# Patient Record
Sex: Female | Born: 1970 | Race: Black or African American | Hispanic: No | Marital: Married | Smoking: Never smoker
Health system: Southern US, Community
[De-identification: ages and names within clinical notes are randomized; demographics above are authoritative.]

## PROBLEM LIST (undated history)

## (undated) DIAGNOSIS — I10 Essential (primary) hypertension: Secondary | ICD-10-CM

## (undated) HISTORY — PX: CHOLECYSTECTOMY: SHX55

## (undated) HISTORY — DX: Essential (primary) hypertension: I10

## (undated) HISTORY — DX: Morbid (severe) obesity due to excess calories: E66.01

---

## 1997-11-10 ENCOUNTER — Other Ambulatory Visit: Admission: RE | Admit: 1997-11-10 | Discharge: 1997-11-10 | Payer: Self-pay | Admitting: Obstetrics and Gynecology

## 1998-05-31 ENCOUNTER — Inpatient Hospital Stay (HOSPITAL_COMMUNITY): Admission: AD | Admit: 1998-05-31 | Discharge: 1998-05-31 | Payer: Self-pay | Admitting: Obstetrics and Gynecology

## 1998-06-01 ENCOUNTER — Ambulatory Visit (HOSPITAL_COMMUNITY): Admission: RE | Admit: 1998-06-01 | Discharge: 1998-06-01 | Payer: Self-pay | Admitting: Obstetrics and Gynecology

## 1998-06-07 ENCOUNTER — Inpatient Hospital Stay (HOSPITAL_COMMUNITY): Admission: AD | Admit: 1998-06-07 | Discharge: 1998-06-10 | Payer: Self-pay | Admitting: Obstetrics and Gynecology

## 1998-06-13 ENCOUNTER — Encounter (HOSPITAL_COMMUNITY): Admission: RE | Admit: 1998-06-13 | Discharge: 1998-09-11 | Payer: Self-pay | Admitting: Obstetrics and Gynecology

## 1998-07-10 ENCOUNTER — Other Ambulatory Visit: Admission: RE | Admit: 1998-07-10 | Discharge: 1998-07-10 | Payer: Self-pay | Admitting: Obstetrics and Gynecology

## 1998-11-21 ENCOUNTER — Ambulatory Visit (HOSPITAL_COMMUNITY): Admission: RE | Admit: 1998-11-21 | Discharge: 1998-11-21 | Payer: Self-pay | Admitting: Internal Medicine

## 1998-11-21 ENCOUNTER — Encounter: Payer: Self-pay | Admitting: Internal Medicine

## 1998-11-23 ENCOUNTER — Ambulatory Visit (HOSPITAL_COMMUNITY): Admission: RE | Admit: 1998-11-23 | Discharge: 1998-11-23 | Payer: Self-pay | Admitting: Gynecology

## 1998-11-23 ENCOUNTER — Encounter: Payer: Self-pay | Admitting: Gynecology

## 1998-12-12 ENCOUNTER — Encounter (HOSPITAL_BASED_OUTPATIENT_CLINIC_OR_DEPARTMENT_OTHER): Payer: Self-pay | Admitting: General Surgery

## 1998-12-13 ENCOUNTER — Encounter (HOSPITAL_BASED_OUTPATIENT_CLINIC_OR_DEPARTMENT_OTHER): Payer: Self-pay | Admitting: General Surgery

## 1998-12-13 ENCOUNTER — Ambulatory Visit (HOSPITAL_COMMUNITY): Admission: RE | Admit: 1998-12-13 | Discharge: 1998-12-14 | Payer: Self-pay | Admitting: General Surgery

## 1999-03-18 ENCOUNTER — Ambulatory Visit (HOSPITAL_COMMUNITY): Admission: RE | Admit: 1999-03-18 | Discharge: 1999-03-18 | Payer: Self-pay | Admitting: General Surgery

## 1999-07-04 ENCOUNTER — Other Ambulatory Visit: Admission: RE | Admit: 1999-07-04 | Discharge: 1999-07-04 | Payer: Self-pay | Admitting: Obstetrics and Gynecology

## 2000-01-02 ENCOUNTER — Inpatient Hospital Stay (HOSPITAL_COMMUNITY): Admission: AD | Admit: 2000-01-02 | Discharge: 2000-01-04 | Payer: Self-pay | Admitting: Obstetrics and Gynecology

## 2000-02-12 ENCOUNTER — Other Ambulatory Visit: Admission: RE | Admit: 2000-02-12 | Discharge: 2000-02-12 | Payer: Self-pay | Admitting: Obstetrics and Gynecology

## 2008-07-12 ENCOUNTER — Encounter: Admission: RE | Admit: 2008-07-12 | Discharge: 2008-07-12 | Payer: Self-pay | Admitting: Internal Medicine

## 2010-05-25 ENCOUNTER — Encounter: Payer: Self-pay | Admitting: Internal Medicine

## 2011-12-30 LAB — LIPID PANEL
HDL: 59 mg/dL (ref 35–70)
LDL Cholesterol: 139 mg/dL
Triglycerides: 85 mg/dL (ref 40–160)

## 2011-12-30 LAB — BASIC METABOLIC PANEL
Glucose: 86 mg/dL
Sodium: 139 mmol/L (ref 137–147)

## 2011-12-30 LAB — CBC AND DIFFERENTIAL
HCT: 34 % — AB (ref 36–46)
Hemoglobin: 11.4 g/dL — AB (ref 12.0–16.0)
Platelets: 447 10*3/uL — AB (ref 150–399)
WBC: 6 10^3/mL

## 2011-12-30 LAB — TSH: TSH: 0.79 u[IU]/mL (ref 0.41–5.90)

## 2012-08-10 ENCOUNTER — Telehealth: Payer: Self-pay | Admitting: *Deleted

## 2012-08-10 NOTE — Telephone Encounter (Signed)
PT CAME IN A WHILE BACK AND DR. Herma Carson PRESCRIBED MEDS FOR HIGH BLOOD PRESSURE AND PT SAID NOT COVERED UNDER INS HAD TO PAY 160.00  PT IS DUE FOR A REFILL NOW AND WANTED TO KNOW IF DR. Z COULD DO GENERIC FORM THAT IS COVERED UNDER HER INS.  INS IS [employer].  PHARMACY USED - [facility] IN [place].

## 2012-08-11 NOTE — Telephone Encounter (Signed)
Please schedule her for an appt.  We can offer her some samples of her bp med but Dr Herma Carson has to change her bp med on an office visit.  PG

## 2012-08-18 ENCOUNTER — Encounter: Payer: Self-pay | Admitting: *Deleted

## 2012-08-18 DIAGNOSIS — I10 Essential (primary) hypertension: Secondary | ICD-10-CM | POA: Insufficient documentation

## 2012-08-24 ENCOUNTER — Encounter: Payer: Self-pay | Admitting: Family Medicine

## 2012-08-24 ENCOUNTER — Ambulatory Visit (INDEPENDENT_AMBULATORY_CARE_PROVIDER_SITE_OTHER): Payer: 59 | Admitting: Family Medicine

## 2012-08-24 VITALS — BP 137/91 | HR 82 | Wt 280.0 lb

## 2012-08-24 DIAGNOSIS — I10 Essential (primary) hypertension: Secondary | ICD-10-CM

## 2012-08-24 MED ORDER — OLMESARTAN MEDOXOMIL-HCTZ 40-25 MG PO TABS: 1.0000 | ORAL_TABLET | Freq: Every day | ORAL | Status: DC

## 2012-08-24 MED ORDER — AMLODIPINE BESYLATE 5 MG PO TABS: 5.0000 mg | ORAL_TABLET | Freq: Every day | ORAL | Status: DC

## 2012-08-24 NOTE — Patient Instructions (Addendum)
1)  Blood Pressure -  Since your insurance will not cover Tribenzor - see if they will cover the combination of Benicar HCT 40/25 and amlodipine 5mg .  If they won't then check to see if they will cover irbesartan 300 mg plus you'll take the HCT 25 mg and amlodipine 5 mg separately.  Let us know if you need a prescription for the HCT and irbesartan.        DASH Diet The DASH diet stands for "Dietary Approaches to Stop Hypertension." It is a healthy eating plan that has been shown to reduce high blood pressure (hypertension) in as little as 14 days, while also possibly providing other significant health benefits. These other health benefits include reducing the risk of breast cancer after menopause and reducing the risk of type 2 diabetes, heart disease, colon cancer, and stroke. Health benefits also include weight loss and slowing kidney failure in patients with chronic kidney disease.  DIET GUIDELINES  Limit salt (sodium). Your diet should contain less than 1500 mg of sodium daily.  Limit refined or processed carbohydrates. Your diet should include mostly whole grains. Desserts and added sugars should be used sparingly.  Include small amounts of heart-healthy fats. These types of fats include nuts, oils, and tub margarine. Limit saturated and trans fats. These fats have been shown to be harmful in the body. CHOOSING FOODS  The following food groups are based on a 2000 calorie diet. See your Registered Dietitian for individual calorie needs. Grains and Grain Products (6 to 8 servings daily)  Eat More Often: Whole-wheat bread, brown rice, whole-grain or wheat pasta, quinoa, popcorn without added fat or salt (air popped).  Eat Less Often: White bread, white pasta, white rice, cornbread. Vegetables (4 to 5 servings daily)  Eat More Often: Fresh, frozen, and canned vegetables. Vegetables may be raw, steamed, roasted, or grilled with a minimal amount of fat.  Eat Less Often/Avoid: Creamed or fried  vegetables. Vegetables in a cheese sauce. Fruit (4 to 5 servings daily)  Eat More Often: All fresh, canned (in natural juice), or frozen fruits. Dried fruits without added sugar. One hundred percent fruit juice ( cup [237 mL] daily).  Eat Less Often: Dried fruits with added sugar. Canned fruit in light or heavy syrup. [employer], Fish, and Poultry (2 servings or less daily. One serving is 3 to 4 oz [85-114 g]).  Eat More Often: Ninety percent or leaner ground beef, tenderloin, sirloin. Round cuts of beef, chicken breast, [place] breast. All fish. Grill, bake, or broil your meat. Nothing should be fried.  Eat Less Often/Avoid: Fatty cuts of meat, [place], or chicken leg, thigh, or wing. Fried cuts of meat or fish. Dairy (2 to 3 servings)  Eat More Often: Low-fat or fat-free milk, low-fat plain or light yogurt, reduced-fat or part-skim cheese.  Eat Less Often/Avoid: Milk (whole, 2%).Whole milk yogurt. Full-fat cheeses. Nuts, Seeds, and Legumes (4 to 5 servings per week)  Eat More Often: All without added salt.  Eat Less Often/Avoid: Salted nuts and seeds, canned beans with added salt. Fats and Sweets (limited)  Eat More Often: Vegetable oils, tub margarines without trans fats, sugar-free gelatin. Mayonnaise and salad dressings.  Eat Less Often/Avoid: Coconut oils, palm oils, butter, stick margarine, cream, half and half, cookies, candy, pie. FOR MORE INFORMATION The Dash Diet Eating Plan: www.dashdiet.org Document Released: 04/10/2011 Document Revised: 07/14/2011 Document Reviewed: 04/10/2011 [facility] Patient Information 2013 [place], [place].

## 2012-08-24 NOTE — Progress Notes (Signed)
Subjective:     Patient ID: Marie Hammond, female   DOB: 01-07-1971, 42 y.o.   MRN: [id number]  HPI Marie Hammond is here today to discuss her hypertension medication (Tribenzor).  It is not covered by her insurance so she would like to be changed to something else. Tribenzor works very well for her.  She does not have any side effects with it.  Her BP is elevated today because she has been out of the [employer].     Review of Systems  Constitutional: Negative for fatigue and unexpected weight change.  Cardiovascular: Negative for chest pain, palpitations and leg swelling.  Gastrointestinal: Negative for abdominal pain.  Genitourinary: Negative for difficulty urinating.  Musculoskeletal: Negative.   Skin: Negative.   Psychiatric/Behavioral: Negative for sleep disturbance.   Past Medical History  Diagnosis Date  . Hypertension   . Morbid obesity    Family History  Problem Relation Age of Onset  . Cancer Paternal Aunt     Paternal Aunt  . Heart disease Maternal Grandfather   . Cancer Cousin     Breast - Paternal Cousin    History   Social History Narrative   Marital Status: Marie Hammond [phone])   Children:  Son Marie Hammond); Daughter Marie Hammond)   Pets: Dogs (2)    Living Situation: Lives with husband and children.    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 No distress.  HENT:  Head: Normocephalic.  Eyes: No scleral icterus.  Neck: No thyromegaly present.  Cardiovascular: Normal rate, regular rhythm and normal heart sounds.   Pulmonary/Chest: Effort normal and breath sounds normal.  Abdominal: There is no tenderness.  Musculoskeletal: She exhibits no edema  and no tenderness.  Neurological: She is alert.  Skin: Skin is warm and dry.  Psychiatric: She has a normal mood and affect. Her behavior is normal. Judgment and thought content normal.       Assessment:     Hypertension  Plan:     She will check with her insurance to see if they will cover a combination of Benicar HCT and amlodipine.  If they don't cover this combination she'll see if they cover irbesartan 300 mg plus HCTZ plus amlodipine.  She was also given a handout for the DASH Diet.      TIME 15 MINUTES:  MORE THAN 50 % OF TIME WAS INVOLVED IN COUNSELING.

## 2012-08-29 ENCOUNTER — Encounter: Payer: Self-pay | Admitting: Family Medicine

## 2013-02-03 ENCOUNTER — Other Ambulatory Visit: Payer: Self-pay | Admitting: Family Medicine

## 2013-02-03 DIAGNOSIS — Z1231 Encounter for screening mammogram for malignant neoplasm of breast: Secondary | ICD-10-CM

## 2013-02-07 ENCOUNTER — Ambulatory Visit (HOSPITAL_BASED_OUTPATIENT_CLINIC_OR_DEPARTMENT_OTHER)
Admission: RE | Admit: 2013-02-07 | Discharge: 2013-02-07 | Disposition: A | Discharge status: Home / Self Care | Payer: 59 | Source: Ambulatory Visit | Attending: Family Medicine | Admitting: Family Medicine

## 2013-02-07 DIAGNOSIS — Z1231 Encounter for screening mammogram for malignant neoplasm of breast: Secondary | ICD-10-CM | POA: Insufficient documentation

## 2013-02-09 ENCOUNTER — Other Ambulatory Visit: Payer: Self-pay | Admitting: Family Medicine

## 2013-02-09 DIAGNOSIS — R928 Other abnormal and inconclusive findings on diagnostic imaging of breast: Secondary | ICD-10-CM

## 2013-02-24 ENCOUNTER — Ambulatory Visit
Admission: RE | Admit: 2013-02-24 | Discharge: 2013-02-24 | Disposition: A | Discharge status: Home / Self Care | Payer: 59 | Source: Ambulatory Visit | Attending: Family Medicine | Admitting: Family Medicine

## 2013-02-24 DIAGNOSIS — R928 Other abnormal and inconclusive findings on diagnostic imaging of breast: Secondary | ICD-10-CM

## 2013-10-04 ENCOUNTER — Other Ambulatory Visit: Payer: Self-pay | Admitting: *Deleted

## 2013-10-04 DIAGNOSIS — I10 Essential (primary) hypertension: Secondary | ICD-10-CM

## 2013-10-04 MED ORDER — OLMESARTAN MEDOXOMIL-HCTZ 40-25 MG PO TABS: 1.0000 | ORAL_TABLET | Freq: Every day | ORAL | Status: AC

## 2013-10-21 ENCOUNTER — Other Ambulatory Visit: Payer: 59

## 2013-10-21 ENCOUNTER — Other Ambulatory Visit: Payer: Self-pay | Admitting: *Deleted

## 2013-10-21 DIAGNOSIS — I10 Essential (primary) hypertension: Secondary | ICD-10-CM

## 2013-10-21 DIAGNOSIS — Z0000 Encounter for general adult medical examination without abnormal findings: Secondary | ICD-10-CM

## 2013-10-24 ENCOUNTER — Other Ambulatory Visit: Payer: 59

## 2013-10-24 LAB — COMPLETE METABOLIC PANEL WITH GFR
ALT: 12 U/L (ref 0–35)
AST: 14 U/L (ref 0–37)
Albumin: 4 g/dL (ref 3.5–5.2)
Alkaline Phosphatase: 58 U/L (ref 39–117)
BUN: 11 mg/dL (ref 6–23)
CO2: 26 mEq/L (ref 19–32)
Calcium: 9.7 mg/dL (ref 8.4–10.5)
Chloride: 102 mEq/L (ref 96–112)
Creat: 0.96 mg/dL (ref 0.50–1.10)
GFR, Est African American: 85 mL/min
GFR, Est Non African American: 74 mL/min
Glucose, Bld: 112 mg/dL — ABNORMAL HIGH (ref 70–99)
Potassium: 3.7 mEq/L (ref 3.5–5.3)
Sodium: 137 mEq/L (ref 135–145)
Total Bilirubin: 0.4 mg/dL (ref 0.2–1.2)
Total Protein: 7.2 g/dL (ref 6.0–8.3)

## 2013-10-24 LAB — CBC WITH DIFFERENTIAL/PLATELET
Basophils Absolute: 0 10*3/uL (ref 0.0–0.1)
Basophils Relative: 0 % (ref 0–1)
Eosinophils Absolute: 0.6 10*3/uL (ref 0.0–0.7)
Eosinophils Relative: 11 % — ABNORMAL HIGH (ref 0–5)
HCT: 35.3 % — ABNORMAL LOW (ref 36.0–46.0)
Hemoglobin: 12.2 g/dL (ref 12.0–15.0)
Lymphocytes Relative: 39 % (ref 12–46)
Lymphs Abs: 2.1 10*3/uL (ref 0.7–4.0)
MCH: 29.5 pg (ref 26.0–34.0)
MCHC: 34.6 g/dL (ref 30.0–36.0)
MCV: 85.3 fL (ref 78.0–100.0)
Monocytes Absolute: 0.4 10*3/uL (ref 0.1–1.0)
Monocytes Relative: 7 % (ref 3–12)
Neutro Abs: 2.3 10*3/uL (ref 1.7–7.7)
Neutrophils Relative %: 43 % (ref 43–77)
Platelets: 425 10*3/uL — ABNORMAL HIGH (ref 150–400)
RBC: 4.14 MIL/uL (ref 3.87–5.11)
RDW: 15.7 % — ABNORMAL HIGH (ref 11.5–15.5)
WBC: 5.3 10*3/uL (ref 4.0–10.5)

## 2013-10-24 LAB — TSH: TSH: 0.847 u[IU]/mL (ref 0.350–4.500)

## 2013-10-24 LAB — LIPID PANEL
Cholesterol: 197 mg/dL (ref 0–200)
HDL: 62 mg/dL (ref >39)
LDL Cholesterol: 122 mg/dL — ABNORMAL HIGH (ref 0–99)
Total CHOL/HDL Ratio: 3.2 Ratio
Triglycerides: 63 mg/dL (ref <150)
VLDL: 13 mg/dL (ref 0–40)

## 2013-10-27 ENCOUNTER — Ambulatory Visit: Payer: 59 | Admitting: Family Medicine

## 2013-11-08 ENCOUNTER — Ambulatory Visit (INDEPENDENT_AMBULATORY_CARE_PROVIDER_SITE_OTHER): Payer: 59 | Admitting: Family Medicine

## 2013-11-08 ENCOUNTER — Encounter: Payer: Self-pay | Admitting: Family Medicine

## 2013-11-08 VITALS — BP 127/87 | HR 86 | Resp 16 | Ht 67.0 in | Wt 294.0 lb

## 2013-11-08 DIAGNOSIS — Z3009 Encounter for other general counseling and advice on contraception: Secondary | ICD-10-CM

## 2013-11-08 DIAGNOSIS — R7301 Impaired fasting glucose: Secondary | ICD-10-CM

## 2013-11-08 DIAGNOSIS — I10 Essential (primary) hypertension: Secondary | ICD-10-CM

## 2013-11-08 LAB — POCT GLYCOSYLATED HEMOGLOBIN (HGB A1C): Hemoglobin A1C: 5.8

## 2013-11-08 MED ORDER — TELMISARTAN-HCTZ 80-25 MG PO TABS: 1.0000 | ORAL_TABLET | Freq: Every day | ORAL | Status: AC

## 2013-11-08 MED ORDER — NORETHIN ACE-ETH ESTRAD-FE 1-20 MG-MCG PO TABS: 1.0000 | ORAL_TABLET | Freq: Every day | ORAL | Status: AC

## 2013-11-08 NOTE — Progress Notes (Signed)
Subjective:    Patient ID: Marie Hammond, female    DOB: 08/15/1970, 43 y.o.   MRN: [id number]  HPI  Marie Hammond is here today to follow up on her recent lab results. Her fasting glucose was elevated so we are checking an A1c. Her blood pressure was elevated when she first was roomed but it is now normal.  She is needing to get refills on the Benicar and her birth control pills.      Review of Systems  Constitutional: Negative for activity change, appetite change, fatigue and unexpected weight change.  HENT: Negative.   Eyes: Negative.   Respiratory: Negative for shortness of breath.   Cardiovascular: Negative for chest pain, palpitations and leg swelling.  Gastrointestinal: Negative for diarrhea and constipation.  Endocrine: Negative.  Negative for polydipsia, polyphagia and polyuria.  Genitourinary: Negative for difficulty urinating.  Musculoskeletal: Negative.   Skin: Negative.   Neurological: Negative.   Hematological: Negative for adenopathy. Does not bruise/bleed easily.  Psychiatric/Behavioral: Negative for behavioral problems, sleep disturbance and dysphoric mood. The patient is not nervous/anxious.   All other systems reviewed and are negative.    Past Medical History  Diagnosis Date  . Hypertension   . Morbid obesity      Past Surgical History  Procedure Laterality Date  . Cholecystectomy       History   Social History Narrative   Marital Status: Married Oswaldo ConroyFred McClean [phone](1998)   Children:  Son Chrissie Noa(William); Daughter Ephriam Knuckles(Christian)   Pets: Dogs (2)    Living Situation: Lives with husband and children.    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 Cancer Paternal  Aunt     Paternal Aunt  . Heart disease Maternal Grandfather   . Cancer Cousin     Breast - Paternal Cousin      Current Outpatient Prescriptions on File Prior to Visit  Medication Sig Dispense Refill  . olmesartan-hydrochlorothiazide (BENICAR HCT) 40-25 MG per tablet Take 1 tablet by mouth daily.  30 tablet  0   No current facility-administered medications on file prior to visit.     Allergies  Allergen Reactions  . Amoxicillin       Objective:   Physical Exam  Vitals reviewed. Constitutional: She is oriented to person, place, and time. She appears well-nourished.  Eyes: Conjunctivae are normal. No scleral icterus.  Neck: Neck supple. No thyromegaly present.  Cardiovascular: Normal rate, regular rhythm and normal heart sounds.   Pulmonary/Chest: Effort normal and breath sounds normal.  Musculoskeletal: She exhibits no edema and no tenderness.  Lymphadenopathy:    She has no cervical adenopathy.  Neurological: She is alert and oriented to person, place, and time.  Skin: Skin is warm and dry.  Psychiatric: She has a normal mood and affect. Her behavior is normal. Judgment and thought content normal.       Assessment & Plan:    Marie Hammond was seen today for medication management.  Diagnoses and associated orders for this visit:  Essential hypertension, benign Comments: We're going to let her try Micardis HCT  to see if this will control her BP a little better.   - telmisartan-hydrochlorothiazide (MICARDIS HCT) 80-25 MG per tablet; Take 1 tablet by mouth daily.  Impaired fasting glucose Comments: Her A1c is 5.7%.   - POCT HgB A1C  Other general counseling and advice for contraceptive management - norethindrone-ethinyl estradiol (JUNEL FE,GILDESS FE,LOESTRIN FE) 1-20 MG-MCG tablet; Take 1 tablet by mouth daily.   TIME SPENT "FACE TO FACE" WITH PATIENT -  30 MINS

## 2013-11-08 NOTE — Patient Instructions (Signed)
1)  Blood Sugar - Get the book "The End of Diabetes" by Dr. Monico HoarJoel Fuhrman and follow it.    2)  Blood Pressure - Take the Benicar HCT till gone then get on the Micardis HCT and be sure that you are on it when I see you for your CPE.    Exercise to Lose Weight Exercise and a healthy diet may help you lose weight. Your doctor may suggest specific exercises. EXERCISE IDEAS AND TIPS  Choose low-cost things you enjoy doing, such as walking, bicycling, or exercising to workout videos.  Take stairs instead of the elevator.  Walk during your lunch break.  Park your car further away from work or school.  Go to a gym or an exercise class.  Start with 5 to 10 minutes of exercise each day. Build up to 30 minutes of exercise 4 to 6 days a week.  Wear shoes with good support and comfortable clothes.  Stretch before and after working out.  Work out until you breathe harder and your heart beats faster.  Drink extra water when you exercise.  Do not do so much that you hurt yourself, feel dizzy, or get very short of breath. Exercises that burn about 150 calories:  Running 1  miles in 15 minutes.  Playing volleyball for 45 to 60 minutes.  Washing and waxing a car for 45 to 60 minutes.  Playing touch football for 45 minutes.  Walking 1  miles in 35 minutes.  Pushing a stroller 1  miles in 30 minutes.  Playing basketball for 30 minutes.  Raking leaves for 30 minutes.  Bicycling 5 miles in 30 minutes.  Walking 2 miles in 30 minutes.  Dancing for 30 minutes.  Shoveling snow for 15 minutes.  Swimming laps for 20 minutes.  Walking up stairs for 15 minutes.  Bicycling 4 miles in 15 minutes.  Gardening for 30 to 45 minutes.  Jumping rope for 15 minutes.  Washing windows or floors for 45 to 60 minutes. Document Released: 05/24/2010 Document Revised: 07/14/2011 Document Reviewed: 05/24/2010 [facility] Patient Information 2015 [place], [place]. This information is not intended to  replace advice given to you by your health care provider. Make sure you discuss any questions you have with your health care provider.

## 2013-11-18 ENCOUNTER — Ambulatory Visit: Payer: 59 | Admitting: Family Medicine

## 2013-12-26 DIAGNOSIS — R7301 Impaired fasting glucose: Secondary | ICD-10-CM | POA: Insufficient documentation

## 2013-12-26 DIAGNOSIS — I10 Essential (primary) hypertension: Secondary | ICD-10-CM | POA: Insufficient documentation

## 2014-07-20 IMAGING — MG MM DIGITAL DIAGNOSTIC LIMITED*R*
4 series · 4 of 4 positions shown · non-contrast
Comparison: 02/07/2013

CLINICAL DATA: Abnormal screening mammogram.

EXAM:
DIGITAL DIAGNOSTIC UNILATERAL RIGHT MAMMOGRAM LIMITED; ULTRASOUND
RIGHT BREAST

[R CC (1 of 2)]
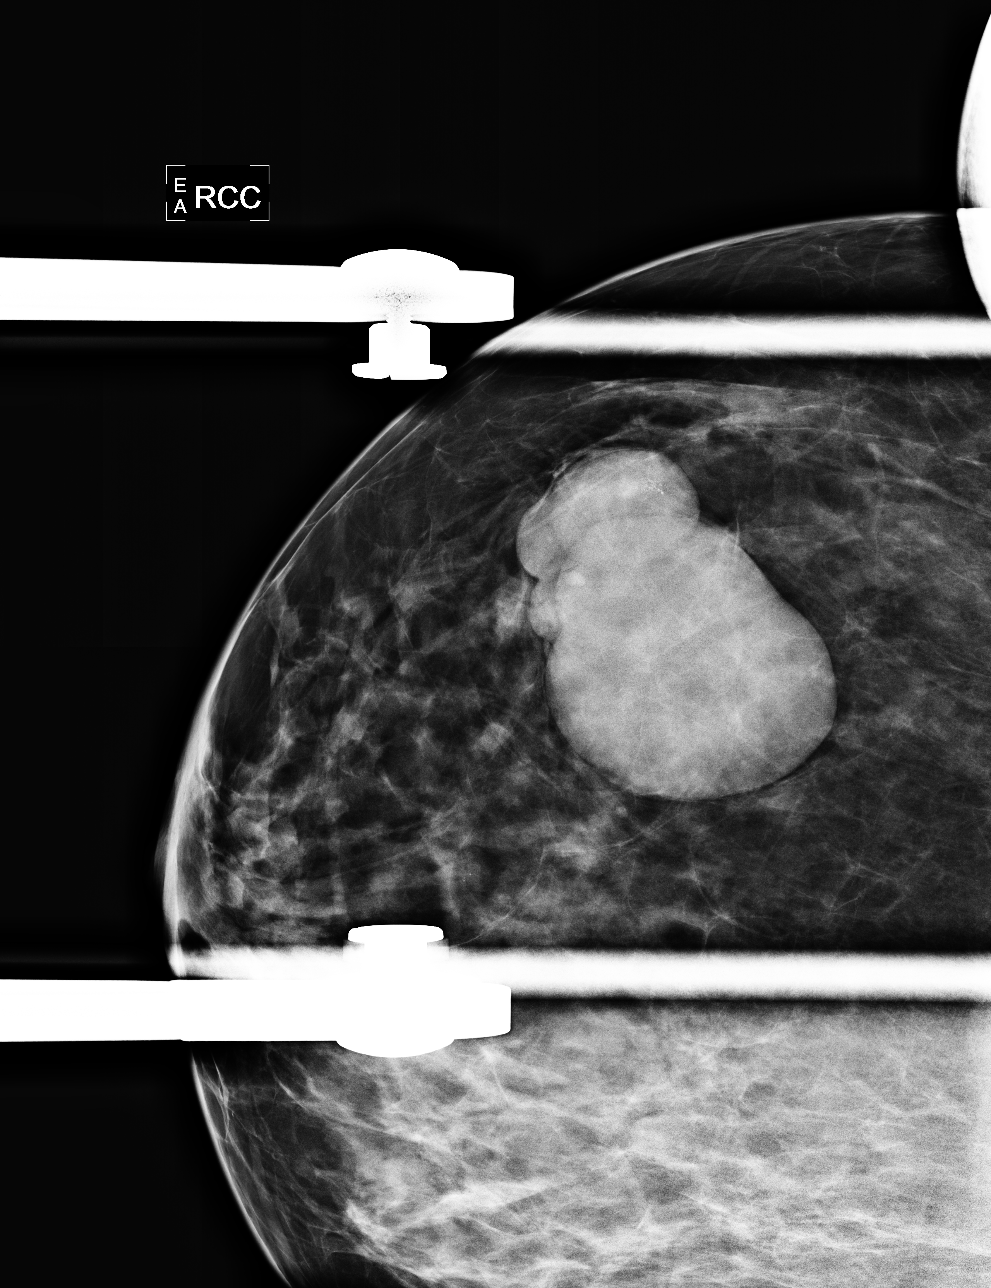

[R MLO]
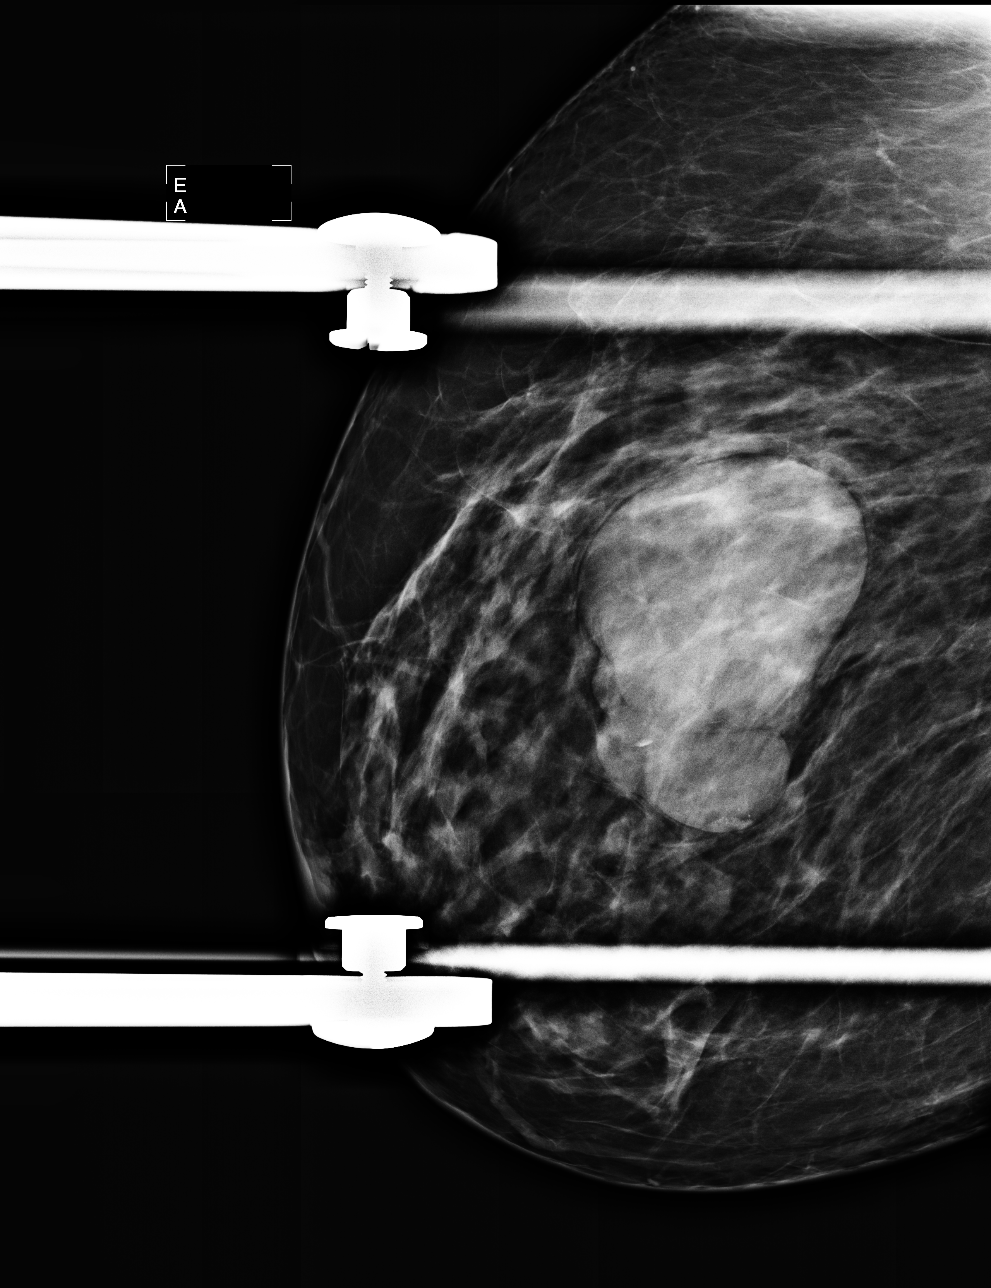

[R CC (2 of 2)]
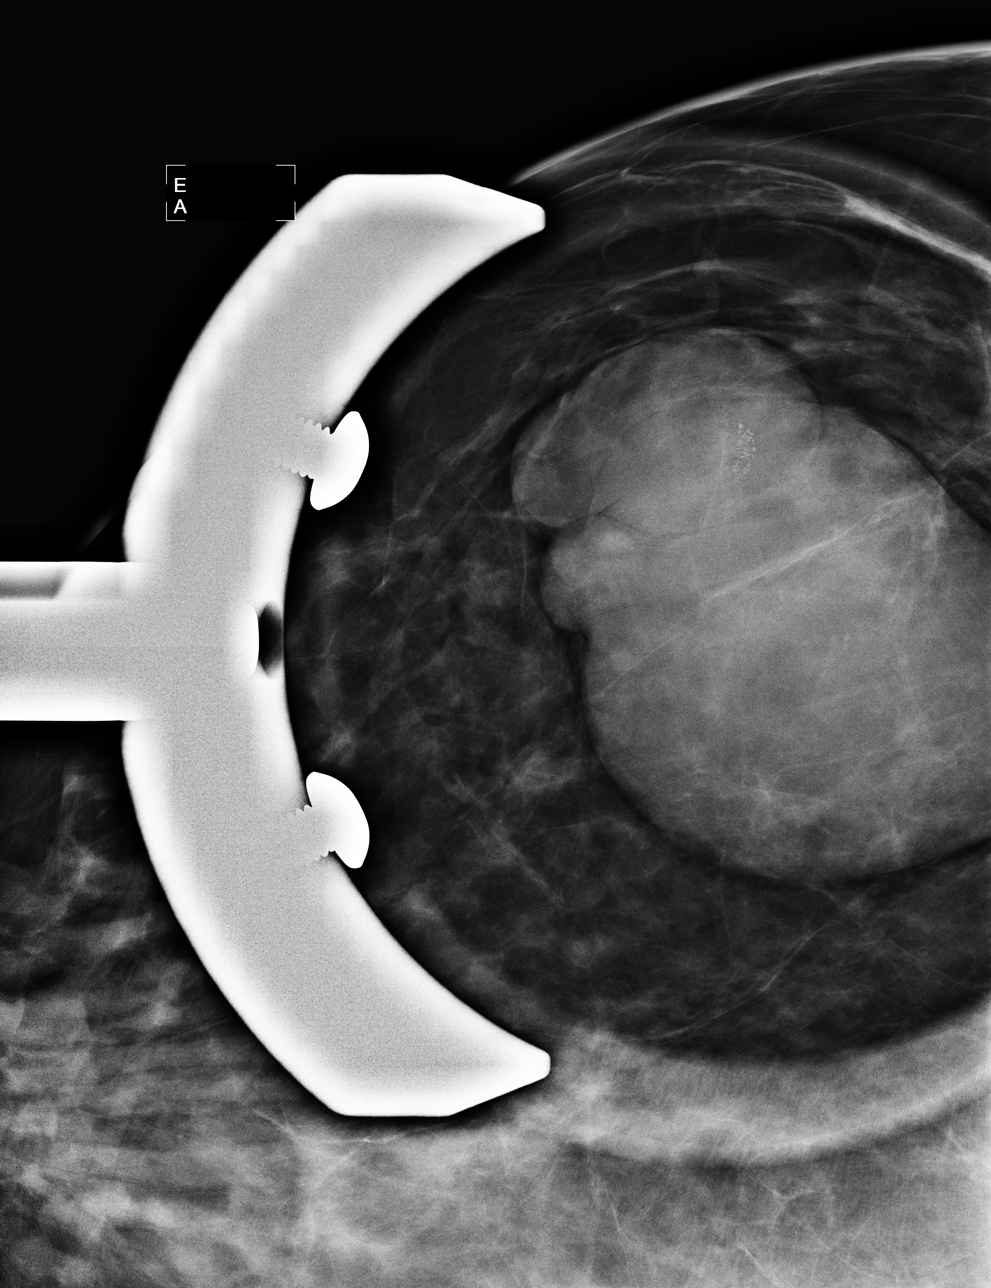

[R ML]
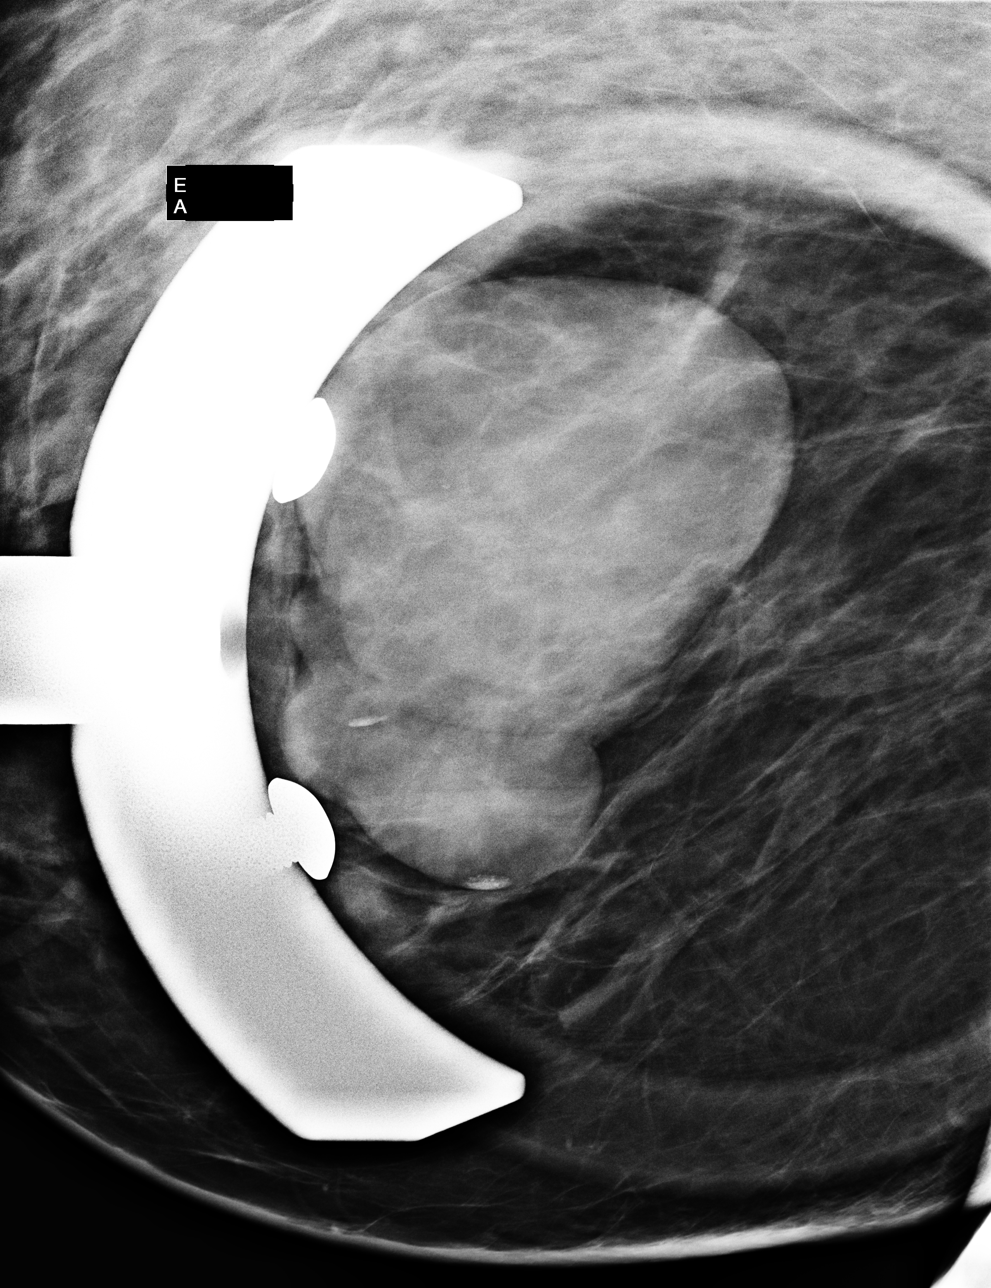

[4 of 4 positions shown; findings below may reference images not displayed]

ACR Breast Density Category b: There are scattered areas of
fibroglandular density.
FINDINGS: Spot compression images demonstrate a circumscribed mass in the
slightly upper outer right breast, measuring up to 6.6 cm. A group
of calcifications are identified within this mass on the CC view.
These are seen to layer dependently within the mass on the lateral
view, indicating milk of calcium.

On physical exam , a mobile soft mass is palpated at [DATE], 6 cm from
the nipple.

Targeted ultrasound at [DATE], 6 cm from the nipple, demonstrates an
anechoic mass with a few thin smooth septations and posterior
acoustic enhancement. This mass measures at least 4.1 x 2.2 x
cm. This correlates with the mammographic finding.
IMPRESSION: Right breast cyst with milk of calcium.

RECOMMENDATION:
Screening mammogram in one year.(Code:79-K-SHN)

I have discussed the findings and recommendations with the patient.
Results were also provided in writing at the conclusion of the
visit. If applicable, a reminder letter will be sent to the patient
regarding the next appointment.

BI-RADS CATEGORY  2: Benign Finding(s)

## 2014-07-20 IMAGING — US US BREAST*R*
1 series · 4 of 4 positions shown · non-contrast
Comparison: 02/07/2013

CLINICAL DATA: Abnormal screening mammogram.

EXAM:
DIGITAL DIAGNOSTIC UNILATERAL RIGHT MAMMOGRAM LIMITED; ULTRASOUND
RIGHT BREAST

[Series 1: breast · 4 of 4 slices shown]
[im 1/4]
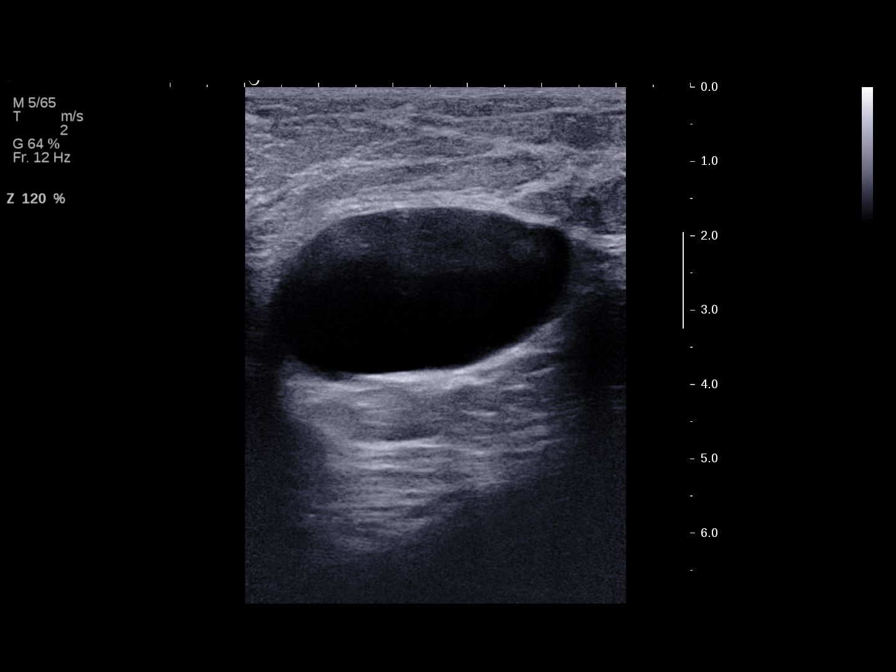
[im 2/4]
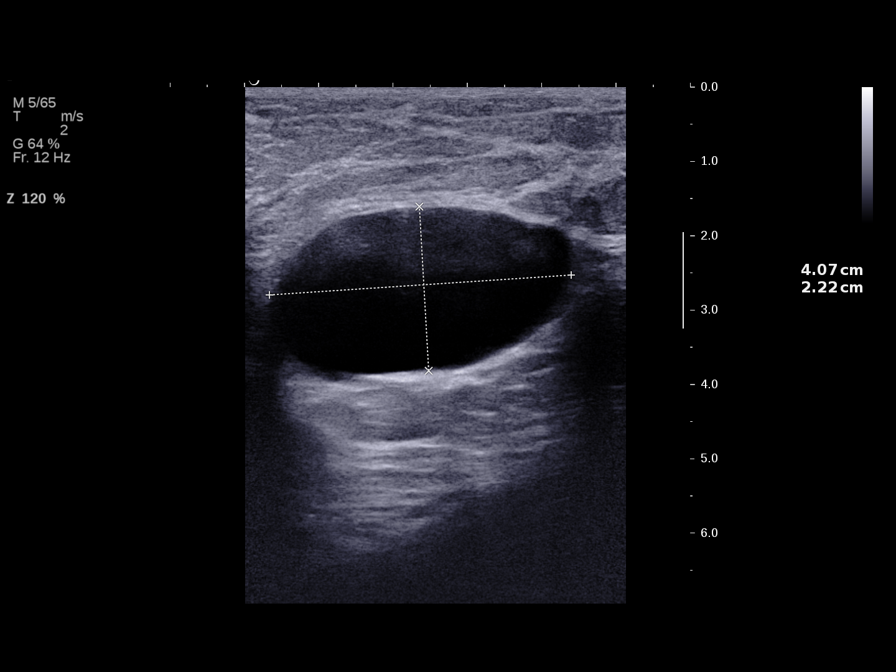
[im 3/4]
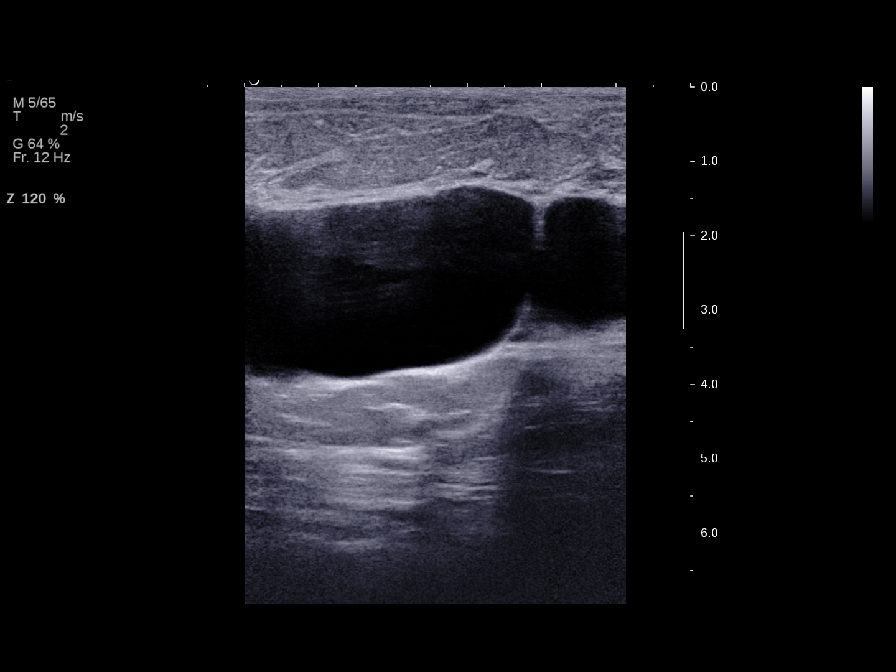
[im 4/4]
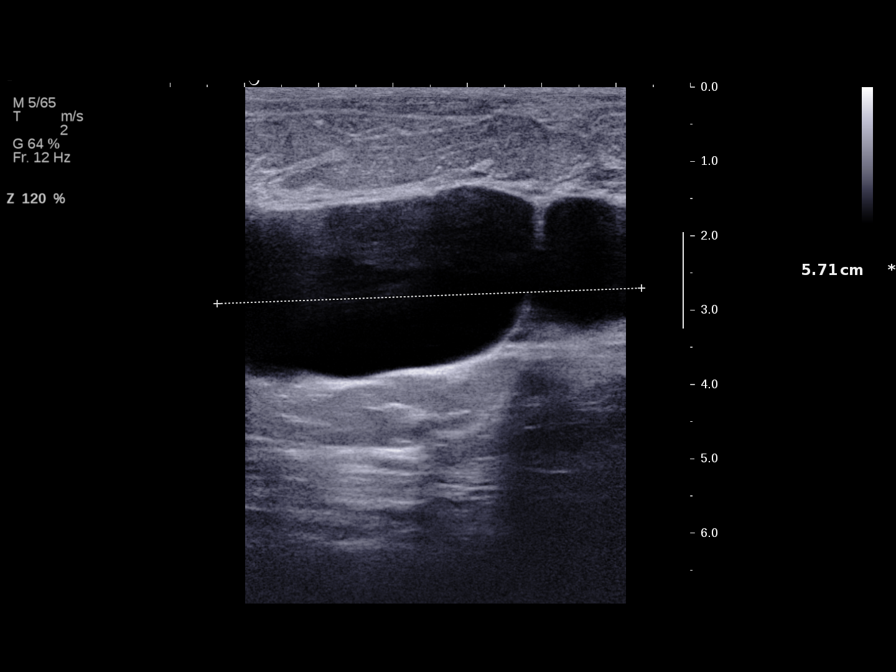

[4 of 4 positions shown; findings below may reference images not displayed]

ACR Breast Density Category b: There are scattered areas of
fibroglandular density.
FINDINGS: Spot compression images demonstrate a circumscribed mass in the
slightly upper outer right breast, measuring up to 6.6 cm. A group
of calcifications are identified within this mass on the CC view.
These are seen to layer dependently within the mass on the lateral
view, indicating milk of calcium.

On physical exam , a mobile soft mass is palpated at [DATE], 6 cm from
the nipple.

Targeted ultrasound at [DATE], 6 cm from the nipple, demonstrates an
anechoic mass with a few thin smooth septations and posterior
acoustic enhancement. This mass measures at least 4.1 x 2.2 x
cm. This correlates with the mammographic finding.
IMPRESSION: Right breast cyst with milk of calcium.

RECOMMENDATION:
Screening mammogram in one year.(Code:79-K-SHN)

I have discussed the findings and recommendations with the patient.
Results were also provided in writing at the conclusion of the
visit. If applicable, a reminder letter will be sent to the patient
regarding the next appointment.

BI-RADS CATEGORY  2: Benign Finding(s)
